# Patient Record
Sex: Male | Born: 1985 | Race: White | Hispanic: No | Marital: Married | Smoking: Never smoker
Health system: Southern US, Community
[De-identification: ages and names within clinical notes are randomized; demographics above are authoritative.]

## PROBLEM LIST (undated history)

## (undated) DIAGNOSIS — F419 Anxiety disorder, unspecified: Secondary | ICD-10-CM

## (undated) DIAGNOSIS — G43909 Migraine, unspecified, not intractable, without status migrainosus: Secondary | ICD-10-CM

---

## 2006-04-28 ENCOUNTER — Emergency Department (HOSPITAL_COMMUNITY): Admission: EM | Admit: 2006-04-28 | Discharge: 2006-04-28 | Payer: Self-pay | Admitting: *Deleted

## 2008-12-30 ENCOUNTER — Emergency Department (HOSPITAL_COMMUNITY): Admission: EM | Admit: 2008-12-30 | Discharge: 2008-12-31 | Payer: Self-pay | Admitting: Emergency Medicine

## 2010-10-14 LAB — DIFFERENTIAL
Basophils Relative: 1 % (ref 0–1)
Lymphs Abs: 2.6 10*3/uL (ref 0.7–4.0)
Monocytes Relative: 7 % (ref 3–12)
Neutro Abs: 8.7 10*3/uL — ABNORMAL HIGH (ref 1.7–7.7)
Neutrophils Relative %: 70 % (ref 43–77)

## 2010-10-14 LAB — COMPREHENSIVE METABOLIC PANEL
Alkaline Phosphatase: 85 U/L (ref 39–117)
BUN: 11 mg/dL (ref 6–23)
Calcium: 9.3 mg/dL (ref 8.4–10.5)
GFR calc non Af Amer: >60 mL/min (ref >60)
Glucose, Bld: 116 mg/dL — ABNORMAL HIGH (ref 70–99)
Total Protein: 6.8 g/dL (ref 6.0–8.3)

## 2010-10-14 LAB — CBC
HCT: 41.4 % (ref 39.0–52.0)
Hemoglobin: 13.9 g/dL (ref 13.0–17.0)
MCHC: 33.5 g/dL (ref 30.0–36.0)
RDW: 13.7 % (ref 11.5–15.5)

## 2010-10-14 LAB — URINALYSIS, ROUTINE W REFLEX MICROSCOPIC
Bilirubin Urine: NEGATIVE
Glucose, UA: NEGATIVE mg/dL
Ketones, ur: NEGATIVE mg/dL
Nitrite: NEGATIVE
pH: 5.5 (ref 5.0–8.0)

## 2010-10-14 LAB — RAPID URINE DRUG SCREEN, HOSP PERFORMED
Barbiturates: NOT DETECTED
Benzodiazepines: NOT DETECTED
Cocaine: NOT DETECTED
Opiates: NOT DETECTED

## 2010-10-14 LAB — URINE MICROSCOPIC-ADD ON

## 2010-10-14 LAB — ACETAMINOPHEN LEVEL: Acetaminophen (Tylenol), Serum: <10 ug/mL — ABNORMAL LOW (ref 10–30)

## 2010-10-14 LAB — SALICYLATE LEVEL: Salicylate Lvl: <4 mg/dL (ref 2.8–20.0)

## 2011-09-30 ENCOUNTER — Ambulatory Visit (INDEPENDENT_AMBULATORY_CARE_PROVIDER_SITE_OTHER): Payer: PRIVATE HEALTH INSURANCE | Admitting: Physician Assistant

## 2011-09-30 VITALS — BP 107/78 | HR 96 | Temp 100.0°F | Resp 16 | Ht 70.5 in | Wt 158.8 lb

## 2011-09-30 DIAGNOSIS — J020 Streptococcal pharyngitis: Secondary | ICD-10-CM

## 2011-09-30 DIAGNOSIS — J029 Acute pharyngitis, unspecified: Secondary | ICD-10-CM

## 2011-09-30 LAB — POCT CBC
HCT, POC: 43.1 % — AB (ref 43.5–53.7)
MCH, POC: 29.6 pg (ref 27–31.2)
MCV: 82.7 fL (ref 80–97)
MID (cbc): 0.7 (ref 0–0.9)
Platelet Count, POC: 178 10*3/uL (ref 142–424)
RBC: 5.21 M/uL (ref 4.69–6.13)
WBC: 11.5 10*3/uL — AB (ref 4.6–10.2)

## 2011-09-30 MED ORDER — TRAMADOL HCL 50 MG PO TABS: 50.0000 mg | ORAL_TABLET | Freq: Three times a day (TID) | ORAL | Status: AC | PRN

## 2011-09-30 MED ORDER — DIPHENHYD-HYDROCORT-NYSTATIN MT SUSP: 5.0000 mL | Freq: Four times a day (QID) | OROMUCOSAL | Status: DC | PRN

## 2011-09-30 MED ORDER — AZITHROMYCIN 250 MG PO TABS: ORAL_TABLET | ORAL | Status: AC

## 2011-09-30 NOTE — Progress Notes (Signed)
Patient ID: Shaun Young MRN: [id number], DOB: [birth year], 26 y.o. Date of Encounter: 09/30/2011, 9:49 AM  Primary Physician: No primary provider on file.  Chief Complaint:  Chief Complaint  Patient presents with  . Sore Throat    x 6 days  . Emesis    only the first day    HPI: 26 y.o. year old male presents with a 6 day history of sore throat. Subjective fever and chills. No cough, congestion, rhinorrhea, sinus pressure, otalgia, or headache. Normal hearing. No GI complaints. Able to swallow saliva, but hurts to do so. Decreased appetite secondary to sore throat, but he is eating and drinking. Pain is greater along the right side of the throat. No change in voice character. At symptom onset he did have 1 episode of emesis. None since.  No known sick contacts. Has tried Catering manager and Tylenol. States the Tylenol helped considerably.   History reviewed. No pertinent past medical history.   Home Meds: Prior to Admission medications   Not on File    Allergies:  Allergies  Allergen Reactions  . Penicillins Rash    History   Social History  . Marital Status: Single    Spouse Name: N/A    Number of Children: N/A  . Years of Education: N/A   Occupational History  . Not on file.   Social History Main Topics  . Smoking status: Never Smoker   . Smokeless tobacco: Never Used  . Alcohol Use: Not on file  . Drug Use: Not on file  . Sexually Active: Not on file   Other Topics Concern  . Not on file   Social History Narrative  . No narrative on file     Review of Systems: Constitutional: negative for night sweats or weight changes HEENT: see above Cardiovascular: negative for chest pain or palpitations Respiratory: negative for hemoptysis, wheezing, or shortness of breath Abdominal: negative for abdominal pain, nausea, or diarrhea Dermatological: negative for rash Neurologic: negative for headache   Physical Exam: Blood pressure 107/78, pulse 96, temperature  100 F (37.8 C), temperature source Oral, resp. rate 16, height 5' 10.5" (1.791 m), weight 158 lb 12.8 oz (72.031 kg)., Body mass index is 22.46 kg/(m^2). General: Well developed, well nourished, in no acute distress. Head: Normocephalic, atraumatic, eyes without discharge, sclera non-icteric, nares are patent. Bilateral auditory canals clear, TM's are without perforation, pearly grey with reflective cone of light bilaterally. No sinus TTP. Oral cavity moist, dentition normal. Posterior pharynx erythematous with exudate and 3+ tonsils bilaterally. No post nasal drip or peritonsillar abscess.  Neck: Supple. No thyromegaly. Full ROM. <2cm AC right side. Lungs: Clear bilaterally to auscultation without wheezes, rales, or rhonchi. Breathing is unlabored. Heart: RRR with S1 S2. No murmurs, rubs, or gallops appreciated. Abdomen: Soft, non-tender, non-distended with normoactive bowel sounds. No hepatomegaly. No rebound/guarding. No obvious abdominal masses. Msk:  Strength and tone normal for age. Extremities: No clubbing or cyanosis. No edema. Neuro: Alert and oriented X 3. Moves all extremities spontaneously. CNII-XII grossly in tact. Psych:  Responds to questions appropriately with a normal affect.   Labs: Results for orders placed in visit on 09/30/11  POCT CBC      Component Value Range   WBC 11.5 (*) 4.6 - 10.2 (K/uL)   Lymph, poc 2.1  0.6 - 3.4    POC LYMPH PERCENT 18.3  10 - 50 (%L)   MID (cbc) 0.7  0 - 0.9    POC MID %  6.4  0 - 12 (%M)   POC Granulocyte 8.7 (*) 2 - 6.9    Granulocyte percent 75.3  37 - 80 (%G)   RBC 5.21  4.69 - 6.13 (M/uL)   Hemoglobin 15.4  14.1 - 18.1 (g/dL)   HCT, POC 16.1 (*) 09.6 - 53.7 (%)   MCV 82.7  80 - 97 (fL)   MCH, POC 29.6  27 - 31.2 (pg)   MCHC 35.7 (*) 31.8 - 35.4 (g/dL)   RDW, POC 04.5     Platelet Count, POC 178  142 - 424 (K/uL)   MPV 9.2  0 - 99.8 (fL)  POCT RAPID STREP A (OFFICE)      Component Value Range   Rapid Strep A Screen Negative   Negative      ASSESSMENT AND PLAN:  26 y.o. year old male with strep pharyngitis, false negative RST -Azithromycin 250 MG #6 2 po first day then 1 po next 4 days no RF -Ultram 50 mg 1 po tid prn pain no RF -DMM -New tooth brush -OOW today -Tylenol/Motrin prn -Rest/fluids -RTC precautions -RTC 3 days if no improvement  Signed, Eula Listen, PA-C 09/30/2011 9:49 AM

## 2011-10-02 NOTE — Progress Notes (Signed)
Quick Note:  Await final results.  Eula Listen, PA-C 10/02/2011 10:06 AM ______

## 2011-10-03 LAB — CULTURE, GROUP A STREP: Organism ID, Bacteria: NORMAL

## 2014-02-01 ENCOUNTER — Emergency Department (HOSPITAL_COMMUNITY): Payer: Worker's Compensation

## 2014-02-01 ENCOUNTER — Emergency Department (HOSPITAL_COMMUNITY)
Admission: EM | Admit: 2014-02-01 | Discharge: 2014-02-01 | Disposition: A | Discharge status: Home / Self Care | Payer: Worker's Compensation | Attending: Emergency Medicine | Admitting: Emergency Medicine

## 2014-02-01 ENCOUNTER — Encounter (HOSPITAL_COMMUNITY): Payer: Self-pay | Admitting: Emergency Medicine

## 2014-02-01 DIAGNOSIS — S0990XA Unspecified injury of head, initial encounter: Secondary | ICD-10-CM | POA: Insufficient documentation

## 2014-02-01 DIAGNOSIS — S0993XA Unspecified injury of face, initial encounter: Secondary | ICD-10-CM | POA: Diagnosis not present

## 2014-02-01 DIAGNOSIS — Z880 Allergy status to penicillin: Secondary | ICD-10-CM | POA: Diagnosis not present

## 2014-02-01 DIAGNOSIS — H6120 Impacted cerumen, unspecified ear: Secondary | ICD-10-CM | POA: Insufficient documentation

## 2014-02-01 DIAGNOSIS — Z23 Encounter for immunization: Secondary | ICD-10-CM | POA: Diagnosis not present

## 2014-02-01 DIAGNOSIS — S1093XA Contusion of unspecified part of neck, initial encounter: Principal | ICD-10-CM

## 2014-02-01 DIAGNOSIS — S0083XA Contusion of other part of head, initial encounter: Principal | ICD-10-CM | POA: Insufficient documentation

## 2014-02-01 DIAGNOSIS — S0003XA Contusion of scalp, initial encounter: Secondary | ICD-10-CM | POA: Insufficient documentation

## 2014-02-01 DIAGNOSIS — M542 Cervicalgia: Secondary | ICD-10-CM

## 2014-02-01 DIAGNOSIS — S0093XA Contusion of unspecified part of head, initial encounter: Secondary | ICD-10-CM

## 2014-02-01 DIAGNOSIS — S199XXA Unspecified injury of neck, initial encounter: Secondary | ICD-10-CM

## 2014-02-01 MED ORDER — TETANUS-DIPHTH-ACELL PERTUSSIS 5-2.5-18.5 LF-MCG/0.5 IM SUSP
0.5000 mL | Freq: Once | INTRAMUSCULAR | Status: AC
  Administered 2014-02-01: 0.5 mL via INTRAMUSCULAR
  Filled 2014-02-01: qty 0.5

## 2014-02-01 MED ORDER — IBUPROFEN 400 MG PO TABS
800.0000 mg | ORAL_TABLET | Freq: Once | ORAL | Status: AC
  Administered 2014-02-01: 800 mg via ORAL
  Filled 2014-02-01: qty 2

## 2014-02-01 MED ORDER — IBUPROFEN 800 MG PO TABS: 800.0000 mg | ORAL_TABLET | Freq: Three times a day (TID) | ORAL | Status: AC

## 2014-02-01 MED ORDER — HYDROCODONE-ACETAMINOPHEN 5-325 MG PO TABS: 1.0000 | ORAL_TABLET | ORAL | Status: AC | PRN

## 2014-02-01 NOTE — Discharge Instructions (Signed)
Call for a follow up appointment with a Family or Primary Care Provider.  Return if Symptoms worsen.   Take medication as prescribed.  Ice your head and face 3-4 times a day. Do not operate heavy machinery or drink alcohol while taking the narcotic pain medication.
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
## 2014-02-01 NOTE — ED Notes (Signed)
Collar removed by PA.

## 2014-02-01 NOTE — ED Notes (Signed)
Back from xray
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
## 2014-02-03 NOTE — ED Provider Notes (Signed)
Medical screening examination/treatment/procedure(s) were performed by non-physician practitioner and as supervising physician I was immediately available for consultation/collaboration.   EKG Interpretation None       Flint MelterElliott L Taimur Fier, MD 02/03/14 1553

## 2015-09-21 ENCOUNTER — Emergency Department (HOSPITAL_BASED_OUTPATIENT_CLINIC_OR_DEPARTMENT_OTHER)
Admission: EM | Admit: 2015-09-21 | Discharge: 2015-09-21 | Disposition: A | Discharge status: Home / Self Care | Payer: Worker's Compensation | Attending: Emergency Medicine | Admitting: Emergency Medicine

## 2015-09-21 ENCOUNTER — Encounter (HOSPITAL_BASED_OUTPATIENT_CLINIC_OR_DEPARTMENT_OTHER): Payer: Self-pay | Admitting: *Deleted

## 2015-09-21 DIAGNOSIS — S6991XA Unspecified injury of right wrist, hand and finger(s), initial encounter: Secondary | ICD-10-CM

## 2015-09-21 DIAGNOSIS — Y998 Other external cause status: Secondary | ICD-10-CM | POA: Insufficient documentation

## 2015-09-21 DIAGNOSIS — Z791 Long term (current) use of non-steroidal anti-inflammatories (NSAID): Secondary | ICD-10-CM | POA: Insufficient documentation

## 2015-09-21 DIAGNOSIS — Y92148 Other place in prison as the place of occurrence of the external cause: Secondary | ICD-10-CM | POA: Diagnosis not present

## 2015-09-21 DIAGNOSIS — Z880 Allergy status to penicillin: Secondary | ICD-10-CM | POA: Diagnosis not present

## 2015-09-21 DIAGNOSIS — Y9389 Activity, other specified: Secondary | ICD-10-CM | POA: Diagnosis not present
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
## 2015-09-21 NOTE — Discharge Instructions (Signed)

## 2018-03-14 ENCOUNTER — Emergency Department (HOSPITAL_COMMUNITY)
Admission: EM | Admit: 2018-03-14 | Discharge: 2018-03-14 | Disposition: A | Discharge status: Home / Self Care | Payer: Commercial Managed Care - PPO | Attending: Emergency Medicine | Admitting: Emergency Medicine

## 2018-03-14 ENCOUNTER — Other Ambulatory Visit: Payer: Self-pay

## 2018-03-14 ENCOUNTER — Encounter (HOSPITAL_COMMUNITY): Payer: Self-pay | Admitting: Emergency Medicine

## 2018-03-14 DIAGNOSIS — F419 Anxiety disorder, unspecified: Secondary | ICD-10-CM | POA: Insufficient documentation

## 2018-03-14 DIAGNOSIS — Y9389 Activity, other specified: Secondary | ICD-10-CM | POA: Diagnosis not present

## 2018-03-14 DIAGNOSIS — Y929 Unspecified place or not applicable: Secondary | ICD-10-CM | POA: Insufficient documentation

## 2018-03-14 DIAGNOSIS — S61012A Laceration without foreign body of left thumb without damage to nail, initial encounter: Secondary | ICD-10-CM | POA: Diagnosis present

## 2018-03-14 DIAGNOSIS — W228XXA Striking against or struck by other objects, initial encounter: Secondary | ICD-10-CM | POA: Diagnosis not present

## 2018-03-14 DIAGNOSIS — Y998 Other external cause status: Secondary | ICD-10-CM | POA: Insufficient documentation

## 2018-03-14 HISTORY — DX: Anxiety disorder, unspecified: F41.9

## 2018-03-14 MED ORDER — LIDOCAINE HCL (PF) 2 % IJ SOLN
INTRAMUSCULAR | Status: AC
  Filled 2018-03-14: qty 20

## 2018-03-14 MED ORDER — POVIDONE-IODINE 10 % EX SOLN
CUTANEOUS | Status: DC | PRN
  Filled 2018-03-14: qty 15

## 2018-03-14 MED ORDER — LIDOCAINE HCL (PF) 2 % IJ SOLN: 10.0000 mL | Freq: Once | INTRAMUSCULAR | Status: DC

## 2018-03-14 NOTE — Discharge Instructions (Addendum)
Have your sutures removed in 10 days.  Keep your wound clean and dry,  Until a good scab forms - you may then wash gently twice daily with mild soap and water, but dry completely after.  Get rechecked for any sign of infection (redness,  Swelling,  Increased pain or drainage of purulent fluid). ° °

## 2018-03-14 NOTE — ED Triage Notes (Signed)
Pt was working on car when wrench slipped cutting his L thumb. Bleeding controlled at this time.

## 2018-03-15 NOTE — ED Provider Notes (Signed)
[facility] EMERGENCY DEPARTMENT Provider Note   CSN: [id number] Arrival date & time: 03/14/18  1856     History   Chief Complaint Chief Complaint  Patient presents with  . Laceration    HPI Shaun Young is a 32 y.o. male with no significant past medical history, right handed, presenting with laceration to his left thumb occurring just prior to arrival when a wrench slipped while doing a car repair, injuring the left thumb.  He reports the wound bled moderately, resolved after direct pressure.  He denies numbness distal to the injury site.  Pain is currently improved, not worsened with movement. His tetanus is current.  The history is provided by the patient.    Past Medical History:  Diagnosis Date  . Anxiety     There are no active problems to display for this patient.   History reviewed. No pertinent surgical history.      Home Medications    Prior to Admission medications   Medication Sig Start Date End Date Taking? Authorizing Provider  HYDROcodone-acetaminophen (NORCO/VICODIN) 5-325 MG per tablet Take 1 tablet by mouth every 4 (four) hours as needed. 02/01/14   Mellody Drown, PA-C  ibuprofen (ADVIL,MOTRIN) 800 MG tablet Take 1 tablet (800 mg total) by mouth 3 (three) times daily. 02/01/14   Mellody Drown, PA-C    Family History No family history on file.  Social History Social History   Tobacco Use  . Smoking status: Never Smoker  . Smokeless tobacco: Never Used  Substance Use Topics  . Alcohol use: Yes  . Drug use: No     Allergies   Penicillins   Review of Systems Review of Systems  Constitutional: Negative for chills and fever.  HENT: Negative.   Respiratory: Negative.   Cardiovascular: Negative.   Skin: Positive for wound.  Neurological: Negative for weakness and numbness.     Physical Exam Updated Vital Signs BP (!) 135/93 (BP Location: Right Arm)   Pulse 79   Temp 98.1 F (36.7 C) (Temporal)   Resp 18   Ht 5\' 10"  (1.778 m)    Wt 82.6 kg   SpO2 98%   BMI 26.11 kg/m   Physical Exam  Constitutional: He is oriented to person, place, and time. He appears well-developed and well-nourished.  HENT:  Head: Normocephalic.  Cardiovascular: Normal rate.  Pulmonary/Chest: Effort normal.  Musculoskeletal: He exhibits tenderness. He exhibits no deformity.  Neurological: He is alert and oriented to person, place, and time. No sensory deficit.  Skin: Laceration noted.  2 cm subcutaneous laceration left thumb, placed vertically over the dip joint. No deep structures visualized. distan sensation intact. Hemostatic.     ED Treatments / Results  Labs (all labs ordered are listed, but only abnormal results are displayed) Labs Reviewed - No data to display  EKG None  Radiology No results found.  Procedures Procedures (including critical care time)  LACERATION REPAIR Performed by: Burgess Amor Authorized by: Burgess Amor Consent: Verbal consent obtained. Risks and benefits: risks, benefits and alternatives were discussed Consent given by: patient Patient identity confirmed: provided demographic data Prepped and Draped in normal sterile fashion Wound explored  Laceration Location: left thumb, dorsal over DIP  Laceration Length: 2cm  No Foreign Bodies seen or palpated  Anesthesia: digital block  Local anesthetic: lidocaine 2% without epinephrine  Anesthetic total: 2 ml  Irrigation method: syringe Amount of cleaning: standard  Skin closure: ethilon 4-0  Number of sutures: 4  Technique: simple interrupted  Patient  tolerance: Patient tolerated the procedure well with no immediate complications.   Medications Ordered in ED Medications - No data to display   Initial Impression / Assessment and Plan / ED Course  I have reviewed the triage vital signs and the nursing notes.  Pertinent labs & imaging results that were available during my care of the patient were reviewed by me and considered in my  medical decision making (see chart for details).     Wound care instructions given.  Pt advised to have sutures removed in 10 days,  Return here sooner for any signs of infection including redness, swelling, worse pain or drainage of pus.     Final Clinical Impressions(s) / ED Diagnoses   Final diagnoses:  Laceration of left thumb without foreign body without damage to nail, initial encounter    ED Discharge Orders    None       Victoriano Lain 03/15/18 2129    Benjiman Core, MD 03/17/18 [phone]

## 2018-10-04 DIAGNOSIS — G43101 Migraine with aura, not intractable, with status migrainosus: Secondary | ICD-10-CM | POA: Diagnosis not present

## 2018-10-04 DIAGNOSIS — R51 Headache: Secondary | ICD-10-CM | POA: Diagnosis not present

## 2018-10-26 DIAGNOSIS — Z3141 Encounter for fertility testing: Secondary | ICD-10-CM | POA: Diagnosis not present

## 2018-12-19 ENCOUNTER — Other Ambulatory Visit: Payer: Self-pay

## 2018-12-19 ENCOUNTER — Emergency Department (HOSPITAL_COMMUNITY): Payer: BC Managed Care – PPO

## 2018-12-19 ENCOUNTER — Encounter (HOSPITAL_COMMUNITY): Payer: Self-pay | Admitting: Emergency Medicine

## 2018-12-19 ENCOUNTER — Emergency Department (HOSPITAL_COMMUNITY)
Admission: EM | Admit: 2018-12-19 | Discharge: 2018-12-19 | Disposition: A | Discharge status: Home / Self Care | Payer: BC Managed Care – PPO | Attending: Emergency Medicine | Admitting: Emergency Medicine

## 2018-12-19 DIAGNOSIS — N202 Calculus of kidney with calculus of ureter: Secondary | ICD-10-CM | POA: Diagnosis not present

## 2018-12-19 DIAGNOSIS — N200 Calculus of kidney: Secondary | ICD-10-CM | POA: Diagnosis not present

## 2018-12-19 DIAGNOSIS — M545 Low back pain: Secondary | ICD-10-CM | POA: Diagnosis not present

## 2018-12-19 DIAGNOSIS — R1011 Right upper quadrant pain: Secondary | ICD-10-CM | POA: Diagnosis present

## 2018-12-19 DIAGNOSIS — N134 Hydroureter: Secondary | ICD-10-CM | POA: Diagnosis not present

## 2018-12-19 HISTORY — DX: Migraine, unspecified, not intractable, without status migrainosus: G43.909

## 2018-12-19 LAB — URINALYSIS, ROUTINE W REFLEX MICROSCOPIC
Bacteria, UA: NONE SEEN
Bilirubin Urine: NEGATIVE
Glucose, UA: NEGATIVE mg/dL
Ketones, ur: NEGATIVE mg/dL
Leukocytes,Ua: NEGATIVE
Nitrite: NEGATIVE
Protein, ur: 100 mg/dL — AB
RBC / HPF: >50 RBC/hpf — ABNORMAL HIGH (ref 0–5)
Specific Gravity, Urine: 1.025 (ref 1.005–1.030)
pH: 5 (ref 5.0–8.0)

## 2018-12-19 MED ORDER — OXYCODONE-ACETAMINOPHEN 5-325 MG PO TABS: 2.0000 | ORAL_TABLET | ORAL | 0 refills | Status: AC | PRN

## 2018-12-19 MED ORDER — TAMSULOSIN HCL 0.4 MG PO CAPS: 0.4000 mg | ORAL_CAPSULE | Freq: Every day | ORAL | 0 refills | Status: AC

## 2018-12-19 MED ORDER — OXYCODONE-ACETAMINOPHEN 5-325 MG PO TABS
1.0000 | ORAL_TABLET | Freq: Once | ORAL | Status: AC
  Administered 2018-12-19: 1 via ORAL
  Filled 2018-12-19: qty 1

## 2018-12-19 MED ORDER — ONDANSETRON HCL 4 MG PO TABS: 4.0000 mg | ORAL_TABLET | Freq: Three times a day (TID) | ORAL | 0 refills | Status: AC | PRN

## 2018-12-19 MED ORDER — OXYCODONE-ACETAMINOPHEN 5-325 MG PO TABS: 1.0000 | ORAL_TABLET | ORAL | 0 refills | Status: AC | PRN

## 2018-12-19 NOTE — ED Triage Notes (Signed)
Patient c/o mid to lower back that started today while trying to "pop back." Denies any complications with BMs or urination. CNS intact. Patient took Aleve at 5pm with no relief.

## 2018-12-19 NOTE — ED Notes (Signed)
Pt states he noted blood in urine while waiting for room

## 2018-12-19 NOTE — ED Notes (Signed)
To CT

## 2018-12-19 NOTE — Discharge Instructions (Signed)
Strain all urine.  Call the urologist listed to arrange a follow up appt.  Return here for any worsening symptoms

## 2018-12-19 NOTE — ED Provider Notes (Signed)
[facility] Provider Note   CSN: [id number] Arrival date & time: 12/19/18  1803     History   Chief Complaint Chief Complaint  Patient presents with   Back Pain    HPI Shaun Young is a 33 y.o. male.     HPI    Shaun Young is a 33 y.o. male who presents to the Emergency Department complaining of sudden onset of right flank pain.  He states that he has been moving heavy objects today and tried to "pop my back" when he felt a sharp stabbing type pain to his right mid to lower back.  He tried taking aleve and lie down which did not improve his pain.  Pain is not associated with movement.  He denies fall, pain, numbness or weakness of the lower extremities, fever, abdominal pain, nausea and vomiting.  He also denies urinary symptoms although he endorses having dark urine and noticed some blood in his urine while voiding here.  No hx of kidney stone.  He denies pain or swelling of his testicles and penile discharge.     Past Medical History:  Diagnosis Date   Anxiety    Migraine     There are no active problems to display for this patient.   History reviewed. No pertinent surgical history.    Home Medications    Prior to Admission medications   Medication Sig Start Date End Date Taking? Authorizing Provider  HYDROcodone-acetaminophen (NORCO/VICODIN) 5-325 MG per tablet Take 1 tablet by mouth every 4 (four) hours as needed. 02/01/14   Mellody DrownParker, Lauren, PA-C  ibuprofen (ADVIL,MOTRIN) 800 MG tablet Take 1 tablet (800 mg total) by mouth 3 (three) times daily. 02/01/14   Mellody DrownParker, Lauren, PA-C    Family History No family history on file.  Social History Social History   Tobacco Use   Smoking status: Never Smoker   Smokeless tobacco: Never Used  Substance Use Topics   Alcohol use: Yes    Comment: rarely   Drug use: No     Allergies   Penicillins   Review of Systems Review of Systems  Constitutional: Negative for chills and fever.    Respiratory: Negative for cough and shortness of breath.   Cardiovascular: Negative for chest pain.  Gastrointestinal: Negative for abdominal pain, nausea and vomiting.  Genitourinary: Positive for flank pain and hematuria. Negative for difficulty urinating, discharge, dysuria, penile swelling, scrotal swelling and testicular pain.  Musculoskeletal: Positive for back pain.  Neurological: Negative for dizziness, weakness and numbness.     Physical Exam Updated Vital Signs BP 126/87    Pulse 69    Temp 98 F (36.7 C) (Oral)    Resp 16    Ht 5\' 10"  (1.778 m)    Wt 89.8 kg    SpO2 100%    BMI 28.41 kg/m   Physical Exam Vitals signs and nursing note reviewed.  Constitutional:      Appearance: Normal appearance. He is not ill-appearing or toxic-appearing.  Neck:     Musculoskeletal: Normal range of motion.  Cardiovascular:     Rate and Rhythm: Normal rate and regular rhythm.     Pulses: Normal pulses.  Pulmonary:     Effort: Pulmonary effort is normal.     Breath sounds: Normal breath sounds.  Chest:     Chest wall: No tenderness.  Abdominal:     General: There is no distension.     Palpations: Abdomen is soft. There is no mass.  Tenderness: There is no abdominal tenderness. There is right CVA tenderness. There is no guarding.  Musculoskeletal: Normal range of motion.     Comments: ttp of the right mid to lower paraspinal muscles.  No midline tenderness.  Neg SLR bilaterally.  Hip flexors and extensors are intact  Skin:    General: Skin is warm.     Findings: No rash.  Neurological:     General: No focal deficit present.     Mental Status: He is alert.     Sensory: No sensory deficit.     Motor: No weakness.      ED Treatments / Results  Labs (all labs ordered are listed, but only abnormal results are displayed) Labs Reviewed  URINALYSIS, ROUTINE W REFLEX MICROSCOPIC - Abnormal; Notable for the following components:      Result Value   Color, Urine AMBER (*)     APPearance CLOUDY (*)    Hgb urine dipstick LARGE (*)    Protein, ur 100 (*)    RBC / HPF >50 (*)    All other components within normal limits  URINE CULTURE    EKG    Radiology Dg Lumbar Spine Complete  Result Date: 12/19/2018 CLINICAL DATA:  LOW BACK PAIN, PER ER NOTE, Patient c/o mid to lower back that started today while trying to "pop back." Denies any complications with BMs or urination. CNS intact. Patient took Aleve at 5pm with no relief, HISTORY OF ANXIETY, MIGRAINES pain, injury today EXAM: LUMBAR SPINE - COMPLETE 4+ VIEW COMPARISON:  None. FINDINGS: Normal alignment of lumbar vertebral bodies. No loss of vertebral body height or disc height. No pars fracture. No subluxation. IMPRESSION: Normal lumbar radiographs. Electronically Signed   By: Genevive BiStewart  Edmunds M.D.   On: 12/19/2018 20:14   Ct Renal Stone Study  Result Date: 12/19/2018 CLINICAL DATA:  Mid lower back pain starting today, flank pain, question stone disease, microscopic hematuria EXAM: CT ABDOMEN AND PELVIS WITHOUT CONTRAST TECHNIQUE: Multidetector CT imaging of the abdomen and pelvis was performed following the standard protocol without IV contrast. Sagittal and coronal MPR images reconstructed from axial data set. Oral contrast not administered for this indication. No oral contrast. COMPARISON:  None FINDINGS: Lower chest: Lung bases clear Hepatobiliary: Gallbladder and liver normal appearance Pancreas: Normal appearance Spleen: Normal appearance Adrenals/Urinary Tract: Adrenal glands normal appearance. Cortical scarring upper pole LEFT kidney. BILATERAL tiny nonobstructing renal calculi. In addition, RIGHT hydronephrosis and RIGHT hydroureter are present secondary to a 5 x 3 mm distal RIGHT ureteral calculus image 81. No definite renal mass lesions. Bladder unremarkable. Stomach/Bowel: Normal appendix. Stomach and bowel loops unremarkable. Vascular/Lymphatic: Vascular structures unremarkable. No adenopathy. Reproductive:  Unremarkable prostate gland and seminal vesicles Other: No free air or free fluid. No hernia or inflammatory process. Musculoskeletal: Normal appearance IMPRESSION: Mild RIGHT hydronephrosis and hydroureter secondary to a 5 x 3 mm distal RIGHT ureteral calculus. Additional tiny BILATERAL nonobstructing renal calculi. Electronically Signed   By: Ulyses SouthwardMark  Boles M.D.   On: 12/19/2018 20:51    Procedures Procedures (including critical care time)  Medications Ordered in ED Medications  oxyCODONE-acetaminophen (PERCOCET/ROXICET) 5-325 MG per tablet 1 tablet (1 tablet Oral Given 12/19/18 2047)     Initial Impression / Assessment and Plan / ED Course  I have reviewed the triage vital signs and the nursing notes.  Pertinent labs & imaging results that were available during my care of the patient were reviewed by me and considered in my medical decision making (see chart  for details).        Pt with flank pain on exam and new onset of hematuria. L spine neg for acute bony process.   No hx of stone.  Will address his pain here and obtain CT sone study.     CT stone study shows right sided stone with hydronephrosis and hydroureter.  Pain improved after percocet.  He is afebrile, no vomiting and no hx of dysuria.  I have cultured his urine, but will refrain from abx until culture resulted.  Pt appears appropriate for d/c home, urine strainer provided.  rx's for pain medication, anti-emetic and flomax.  Referral info given for urology f/u.  Pt agrees to plan.   Final Clinical Impressions(s) / ED Diagnoses   Final diagnoses:  Kidney stone on right side    ED Discharge Orders    None       Bufford Lope 12/19/18 2324    Virgel Manifold, MD 12/20/18 1615

## 2018-12-20 DIAGNOSIS — N201 Calculus of ureter: Secondary | ICD-10-CM | POA: Diagnosis not present

## 2018-12-20 MED FILL — Ondansetron HCl Tab 4 MG: ORAL | Qty: 4 | Status: AC

## 2018-12-20 MED FILL — Oxycodone w/ Acetaminophen Tab 5-325 MG: ORAL | Qty: 6 | Status: AC

## 2018-12-21 LAB — URINE CULTURE: Culture: NO GROWTH

## 2019-01-20 DIAGNOSIS — N200 Calculus of kidney: Secondary | ICD-10-CM | POA: Diagnosis not present

## 2019-04-20 DIAGNOSIS — L089 Local infection of the skin and subcutaneous tissue, unspecified: Secondary | ICD-10-CM | POA: Diagnosis not present

## 2020-01-02 IMAGING — DX LUMBAR SPINE - COMPLETE 4+ VIEW
5 series · 5 of 5 positions shown · non-contrast
Comparison: None.

CLINICAL DATA: LOW BACK PAIN, PER ER NOTE, Patient c/o mid to lower
back that started today while trying to "pop back." Denies any
complications with BMs or urination. CNS intact. Patient took Aleve
at 5pm with no relief, HISTORY OF ANXIETY, MIGRAINES pain, injury
today

EXAM:
LUMBAR SPINE - COMPLETE 4+ VIEW

[l-spine ap]
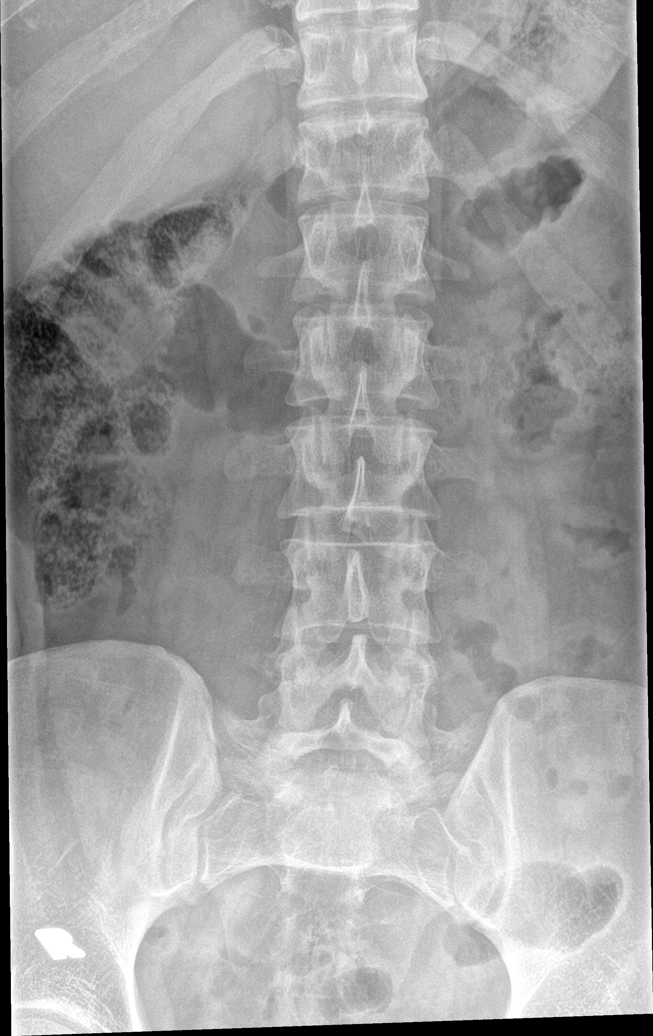

[l-spine obl (1 of 2)]
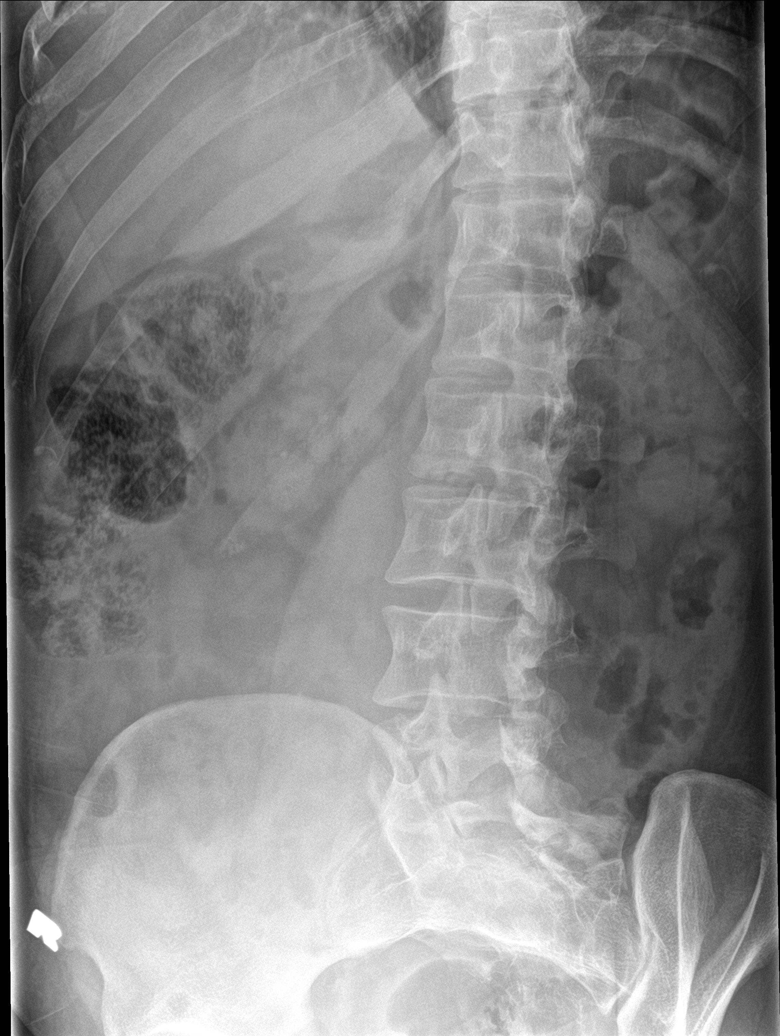

[l-spine obl (2 of 2)]
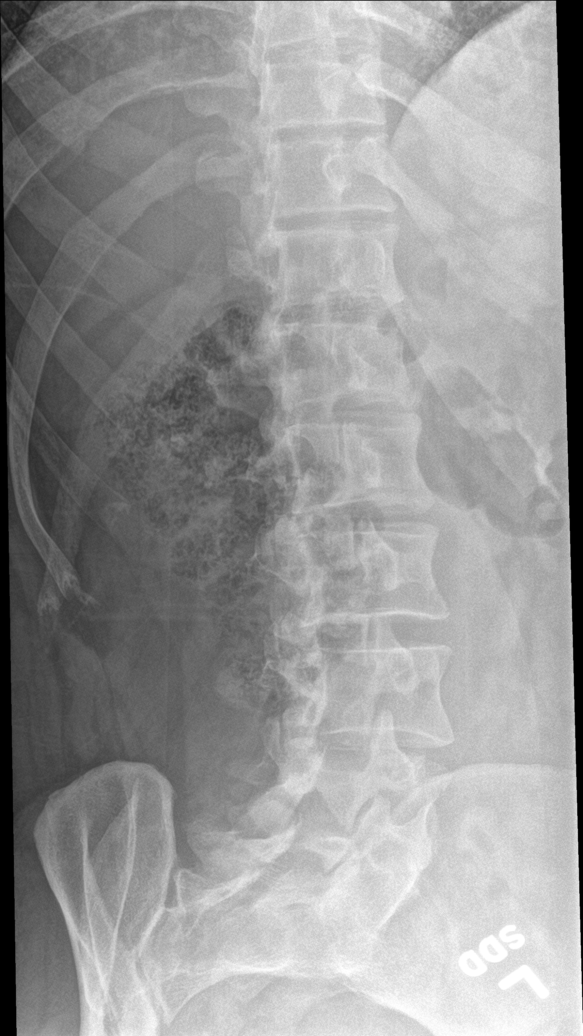

[l-spine lat]
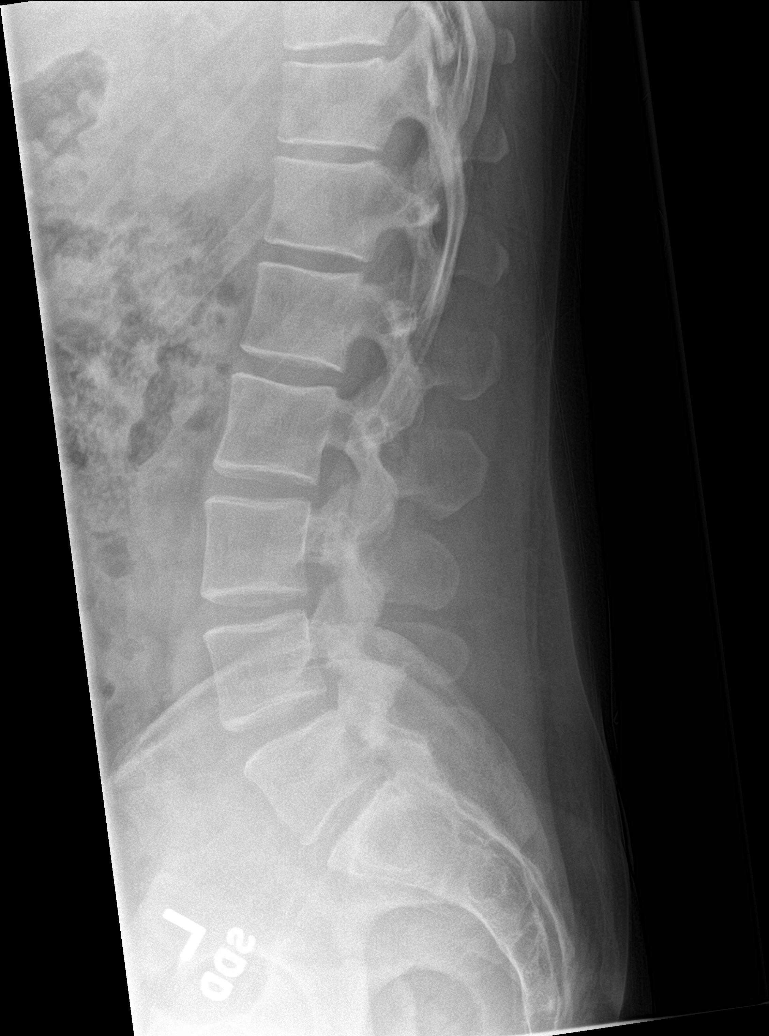

[l-spine spot]
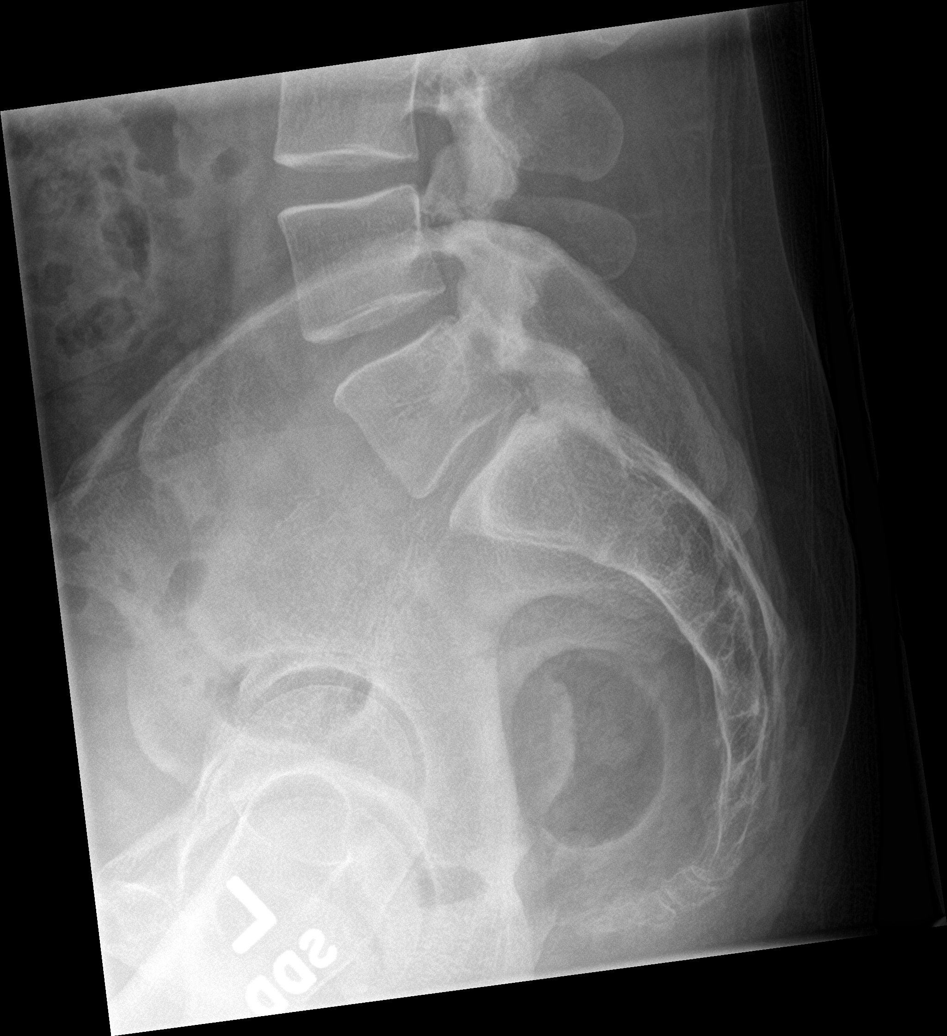

[5 of 5 positions shown; findings below may reference images not displayed]

FINDINGS: Normal alignment of lumbar vertebral bodies. No loss of vertebral
body height or disc height. No pars fracture. No subluxation.
IMPRESSION: Normal lumbar radiographs.

## 2020-01-12 DIAGNOSIS — M25511 Pain in right shoulder: Secondary | ICD-10-CM | POA: Diagnosis not present

## 2020-01-18 DIAGNOSIS — M25511 Pain in right shoulder: Secondary | ICD-10-CM | POA: Diagnosis not present

## 2020-01-30 DIAGNOSIS — M7551 Bursitis of right shoulder: Secondary | ICD-10-CM | POA: Diagnosis not present

## 2020-02-29 DIAGNOSIS — M25511 Pain in right shoulder: Secondary | ICD-10-CM | POA: Diagnosis not present

## 2020-06-04 DIAGNOSIS — Z0000 Encounter for general adult medical examination without abnormal findings: Secondary | ICD-10-CM | POA: Diagnosis not present
